# Patient Record
Sex: Female | Born: 1980 | Race: White | Hispanic: No | Marital: Married | Smoking: Never smoker
Health system: Southern US, Community
[De-identification: ages and names within clinical notes are randomized; demographics above are authoritative.]

---

## 2005-09-30 ENCOUNTER — Ambulatory Visit: Payer: Self-pay | Admitting: Internal Medicine

## 2007-09-28 ENCOUNTER — Ambulatory Visit (HOSPITAL_COMMUNITY): Admission: RE | Admit: 2007-09-28 | Discharge: 2007-09-28 | Payer: Self-pay | Admitting: Obstetrics

## 2008-09-19 ENCOUNTER — Inpatient Hospital Stay (HOSPITAL_COMMUNITY): Admission: AD | Admit: 2008-09-19 | Discharge: 2008-09-22 | Payer: Self-pay | Admitting: Obstetrics

## 2008-09-23 ENCOUNTER — Encounter: Admission: RE | Admit: 2008-09-23 | Discharge: 2008-10-22 | Payer: Self-pay | Admitting: Obstetrics

## 2008-10-23 ENCOUNTER — Encounter: Admission: RE | Admit: 2008-10-23 | Discharge: 2008-10-28 | Payer: Self-pay | Admitting: Obstetrics

## 2010-06-08 ENCOUNTER — Inpatient Hospital Stay (HOSPITAL_COMMUNITY): Admission: AD | Admit: 2010-06-08 | Payer: Self-pay | Admitting: Obstetrics and Gynecology

## 2010-08-25 ENCOUNTER — Inpatient Hospital Stay (HOSPITAL_COMMUNITY): Payer: BC Managed Care – PPO

## 2010-08-25 ENCOUNTER — Inpatient Hospital Stay (HOSPITAL_COMMUNITY)
Admission: AD | Admit: 2010-08-25 | Discharge: 2010-08-25 | Disposition: A | Discharge status: Home / Self Care | Payer: BC Managed Care – PPO | Source: Ambulatory Visit | Attending: Obstetrics and Gynecology | Admitting: Obstetrics and Gynecology

## 2010-08-25 DIAGNOSIS — O26859 Spotting complicating pregnancy, unspecified trimester: Secondary | ICD-10-CM | POA: Insufficient documentation

## 2010-08-25 LAB — URINALYSIS, ROUTINE W REFLEX MICROSCOPIC
Bilirubin Urine: NEGATIVE
Hgb urine dipstick: NEGATIVE
Ketones, ur: NEGATIVE mg/dL
Specific Gravity, Urine: 1.025 (ref 1.005–1.030)
Urobilinogen, UA: 0.2 mg/dL (ref 0.0–1.0)

## 2010-08-29 NOTE — Consult Note (Signed)
  [name], SPRAGGINS            ACCOUNT NO.:  [id number]  MEDICAL RECORD NO.:  [id number]           PATIENT TYPE:  O  LOCATION:  [facility]                         [facility]  PHYSICIAN:  Lenoard Aden, M.D.DATE OF BIRTH:  04-13-1981  DATE OF CONSULTATION: DATE OF DISCHARGE:                                CONSULTATION   CHIEF COMPLAINT:  Episode of spotting.  HISTORY OF PRESENT ILLNESS:  She is a 30 year old white female G2, P1 at 29-6/7 weeks' gestation with a known history of placenta previa with a low-lying placenta on her most recent ultrasound approximately 1.8 cm from the cervical os who presents now with 1 episode of spotting on tissue paper this morning.  Her prenatal course has been otherwise uncomplicated.  ALLERGIES:  She has allergies to CECLOR.  PAST MEDICAL HISTORY:  She has a medical history remarkable for gastroesophageal reflux.  MEDICATIONS:  Include; iron, prenatal vitamins and Zyrtec.  FAMILY HISTORY:  ALS, anemia, thyroid dysfunction, diabetes and cystic fibrosis.  She has a history of vaginal delivery in 2010, otherwise, uncomplicated prenatal course today complicated only by previa.  PHYSICAL EXAMINATION:  GENERAL:  This is a well-developed, well- nourished white female in no acute distress. VITAL SIGNS:  Blood pressure 123/70, pulse of 69-91, respirations 16, temperature 97.9. HEENT:  Normal. NECK:  Supple, full range of motion. LUNGS:  Clear. HEART:  Regular rhythm. ABDOMEN:  Soft, gravid, nontender.  No CVA tenderness. EXTREMITIES:  No cords. NEUROLOGIC:  Nonfocal. SKIN:  Intact. PELVIC:  Deferred.  LABORATORY DATA:  Urinalysis is negative.  Fetal heart rate tracing is category one with fetal heart tones in the 140-150 beat per minute range accelerations, no contractions noted.  No decelerations noted. Ultrasound performed reveals evidence of the placenta approximately 3- 1/2 cm from the internal cervical os.  No evidence of  subchorionic hematoma, normal amniotic fluid index, normal cervical length of approximately 4.5 cm.  No funneling noted.  IMPRESSION:  A 29-6/7 week intrauterine pregnancy, 2 episode of spotting unrelated to placentation with no evidence of low-lying placenta or percent previa and normal urinalysis.  No subchorionic hemorrhage.  No preterm cervical change.  PLAN:  Discharge home.  Precautions given.  Follow up in the office as scheduled.  The patient's blood type is O+.     Lenoard Aden, M.D.     RJT/MEDQ  D:  08/25/2010  T:  08/25/2010  Job:  [id number]  Electronically Signed by Olivia Mackie M.D. on 08/29/2010 01:37:06 PM

## 2010-09-18 LAB — CBC
HCT: 29.6 % — ABNORMAL LOW (ref 36.0–46.0)
Hemoglobin: 13.1 g/dL (ref 12.0–15.0)
MCHC: 34.1 g/dL (ref 30.0–36.0)
MCV: 88.6 fL (ref 78.0–100.0)
MCV: 88.6 fL (ref 78.0–100.0)
Platelets: 141 10*3/uL — ABNORMAL LOW (ref 150–400)
RBC: 4.32 MIL/uL (ref 3.87–5.11)
RDW: 13.5 % (ref 11.5–15.5)

## 2010-10-22 NOTE — H&P (Signed)
NAMELATEEFAH, [name]            ACCOUNT NO.:  [id number]   MEDICAL RECORD NO.:  [id number]          PATIENT TYPE:  INP   LOCATION:  9107                          [facility]ILITY:  [facility]   PHYSICIAN:  Lenoard Aden, M.D.DATE OF BIRTH:  April 14, 1981   DATE OF ADMISSION:  09/19/2008  DATE OF DISCHARGE:                              HISTORY & PHYSICAL   CHIEF COMPLAINT:  Labor.   She is a 30 year old white female G1, P0 at 12 and 4/7th weeks'  gestation with increased frequency of contractions today.   ALLERGIES:  She has allergy to Ceclor.   MEDICATIONS:  Prenatal vitamins.   History of skin grafting on her mouth, I believe in 2009.  History of  chlamydia.  She is a nonsmoker, nondrinker and denies domestic physical  violence.   FAMILY HISTORY:  Cystic fibrosis, diabetes, anemia, ALS, thyroid  dysfunction.   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  GBS is negative.   PHYSICAL EXAMINATION:  GENERAL:  Well-developed, well-nourished white  female in no acute distress.  HEENT:  Normal.  LUNGS:  Clear.  HEART:  Regular rate and rhythm.  ABDOMEN:  Soft, gravid, and nontender.  Estimated fetal weight is 8  pounds.  Cervix is 2, 90% vertex, -1.  EXTREMITIES:  There are no cords.  NEUROLOGIC:  Nonfocal.  SKIN:  Intact.   NST is reactive.  Contractions are every 3-6 minutes.   IMPRESSION:  A 40-week intrauterine pregnancy in early labor.   PLAN:  Admit, amniotomy, epidural p.r.n., anticipate cautious attempts  at vaginal delivery.      Lenoard Aden, M.D.  Electronically Signed     RJT/MEDQ  D:  09/19/2008  T:  09/20/2008  Job:  [id number]

## 2010-10-27 ENCOUNTER — Inpatient Hospital Stay (HOSPITAL_COMMUNITY)
Admission: AD | Admit: 2010-10-27 | Discharge: 2010-10-29 | DRG: 373 | Disposition: A | Discharge status: Home / Self Care | Payer: BC Managed Care – PPO | Source: Ambulatory Visit | Attending: Obstetrics and Gynecology | Admitting: Obstetrics and Gynecology

## 2010-10-27 LAB — CBC
MCHC: 34.9 g/dL (ref 30.0–36.0)
Platelets: 187 10*3/uL (ref 150–400)
RDW: 13.4 % (ref 11.5–15.5)

## 2010-10-28 LAB — ABO/RH: ABO/RH(D): O POS

## 2010-10-28 LAB — RPR: RPR Ser Ql: NONREACTIVE

## 2010-10-29 ENCOUNTER — Encounter (HOSPITAL_COMMUNITY)
Admission: RE | Admit: 2010-10-29 | Discharge: 2010-10-29 | Disposition: A | Discharge status: Home / Self Care | Payer: BC Managed Care – PPO | Source: Ambulatory Visit | Attending: Obstetrics and Gynecology | Admitting: Obstetrics and Gynecology

## 2010-10-29 DIAGNOSIS — O923 Agalactia: Secondary | ICD-10-CM | POA: Insufficient documentation

## 2010-10-29 LAB — CBC
MCHC: 33.2 g/dL (ref 30.0–36.0)
RDW: 13.2 % (ref 11.5–15.5)

## 2010-11-08 NOTE — H&P (Signed)
  NAMEMICHAELLE, [name]            ACCOUNT NO.:  [id number]  MEDICAL RECORD NO.:  [id number]        PATIENT TYPE:  WINP  LOCATION:  167                           [facility]  PHYSICIAN:  Lenoard Aden, M.D.DATE OF BIRTH:  1981/01/08  DATE OF ADMISSION:  10/27/2010 DATE OF DISCHARGE:                             HISTORY & PHYSICAL   CHIEF COMPLAINT:  The patient is a 39 weeks with a history of spontaneous fetal cephalohematoma, poor controlled induction, and avoid operative vaginal delivery.  The patient declines primary C-section. She is a 30 year old white female G2, P1, with a history of spontaneous cephalohematoma with long-term stigmata with a previous pregnancy.  The patient wishes to avoid recurrence.  Risks and benefits were discussed. The patient declines primary C-section.  ALLERGIES:  She has allergies to CECLOR.  MEDICATIONS:  Prenatal vitamins and Zyrtec as needed.  FAMILY HISTORY:  She has a family history of ALS, anemia, cystic fibrosis, thyroid dysfunction, diabetes.  She has a history as noted of 7 pounds 7 ounces female born in 2010.  The prenatal course today uncomplicated.  She has also surgical history remarkable for skin grafting in her mouth includes wisdom tooth extraction.  PHYSICAL EXAMINATION:  GENERAL:  She is a well-developed, well-nourished white female, in no acute distress. HEENT:  Normal. LUNGS:  Clear. HEART:  Regular rate and rhythm. ABDOMEN:  Soft, gravid, nontender.  Estimated fetal weight 7.5 pounds. Cervix is 2-3 cm, 70%, vertex -1. EXTREMITIES:  There are no cords. NEUROLOGIC:  Nonfocal. SKIN:  Intact.  Cervidil was placed.  NST is reactive.  IMPRESSION:  A 39-week intrauterine pregnancy who attempts for cervical ripening and vaginal delivery.  Previous history of spontaneous cephalohematoma, desire to avoid.  Potential risks and benefits of proceeding with unable to push, and inability to prevent spontaneous recurrence  were discussed.  The patient acknowledges and wishes to proceed.  We will proceed with Cervidil, Pitocin in a.m.  Epidural as needed.     Lenoard Aden, M.D.     RJT/MEDQ  D:  10/27/2010  T:  10/28/2010  Job:  [id number]  Electronically Signed by [name] M.D. on 11/08/2010 02:01:00 PM

## 2013-01-31 ENCOUNTER — Other Ambulatory Visit (HOSPITAL_BASED_OUTPATIENT_CLINIC_OR_DEPARTMENT_OTHER): Payer: Self-pay | Admitting: Family Medicine

## 2013-01-31 DIAGNOSIS — R1011 Right upper quadrant pain: Secondary | ICD-10-CM

## 2013-02-01 ENCOUNTER — Ambulatory Visit (HOSPITAL_BASED_OUTPATIENT_CLINIC_OR_DEPARTMENT_OTHER)
Admission: RE | Admit: 2013-02-01 | Discharge: 2013-02-01 | Disposition: A | Discharge status: Home / Self Care | Payer: BC Managed Care – PPO | Source: Ambulatory Visit | Attending: Family Medicine | Admitting: Family Medicine

## 2013-02-01 DIAGNOSIS — R1011 Right upper quadrant pain: Secondary | ICD-10-CM | POA: Insufficient documentation

## 2013-02-08 ENCOUNTER — Other Ambulatory Visit (HOSPITAL_COMMUNITY): Payer: Self-pay | Admitting: Family Medicine

## 2013-02-08 DIAGNOSIS — R1011 Right upper quadrant pain: Secondary | ICD-10-CM

## 2013-02-15 ENCOUNTER — Ambulatory Visit (HOSPITAL_COMMUNITY)
Admission: RE | Admit: 2013-02-15 | Discharge: 2013-02-15 | Disposition: A | Discharge status: Home / Self Care | Payer: BC Managed Care – PPO | Source: Ambulatory Visit | Attending: Family Medicine | Admitting: Family Medicine

## 2013-02-15 DIAGNOSIS — R110 Nausea: Secondary | ICD-10-CM | POA: Insufficient documentation

## 2013-02-15 DIAGNOSIS — R1011 Right upper quadrant pain: Secondary | ICD-10-CM

## 2013-02-15 MED ORDER — TECHNETIUM TC 99M MEBROFENIN IV KIT
5.5000 | PACK | Freq: Once | INTRAVENOUS | Status: AC | PRN
  Administered 2013-02-15: 6 via INTRAVENOUS

## 2013-02-28 ENCOUNTER — Other Ambulatory Visit (HOSPITAL_BASED_OUTPATIENT_CLINIC_OR_DEPARTMENT_OTHER): Payer: Self-pay | Admitting: Family Medicine

## 2013-02-28 DIAGNOSIS — R1011 Right upper quadrant pain: Secondary | ICD-10-CM

## 2013-03-02 ENCOUNTER — Ambulatory Visit (HOSPITAL_BASED_OUTPATIENT_CLINIC_OR_DEPARTMENT_OTHER)
Admission: RE | Admit: 2013-03-02 | Discharge: 2013-03-02 | Disposition: A | Discharge status: Home / Self Care | Payer: BC Managed Care – PPO | Source: Ambulatory Visit | Attending: Family Medicine | Admitting: Family Medicine

## 2013-03-02 DIAGNOSIS — R1011 Right upper quadrant pain: Secondary | ICD-10-CM | POA: Insufficient documentation

## 2013-03-02 MED ORDER — IOHEXOL 300 MG/ML  SOLN
100.0000 mL | Freq: Once | INTRAMUSCULAR | Status: AC | PRN
  Administered 2013-03-02: 100 mL via INTRAVENOUS

## 2013-11-18 ENCOUNTER — Encounter (HOSPITAL_BASED_OUTPATIENT_CLINIC_OR_DEPARTMENT_OTHER): Payer: Self-pay | Admitting: Emergency Medicine

## 2013-11-18 ENCOUNTER — Emergency Department (HOSPITAL_BASED_OUTPATIENT_CLINIC_OR_DEPARTMENT_OTHER)
Admission: EM | Admit: 2013-11-18 | Discharge: 2013-11-18 | Disposition: A | Discharge status: Home / Self Care | Payer: Commercial Managed Care - PPO | Attending: Emergency Medicine | Admitting: Emergency Medicine

## 2013-11-18 DIAGNOSIS — S71109A Unspecified open wound, unspecified thigh, initial encounter: Principal | ICD-10-CM | POA: Insufficient documentation

## 2013-11-18 DIAGNOSIS — F411 Generalized anxiety disorder: Secondary | ICD-10-CM | POA: Insufficient documentation

## 2013-11-18 DIAGNOSIS — W64XXXA Exposure to other animate mechanical forces, initial encounter: Secondary | ICD-10-CM | POA: Insufficient documentation

## 2013-11-18 DIAGNOSIS — S71152A Open bite, left thigh, initial encounter: Secondary | ICD-10-CM

## 2013-11-18 DIAGNOSIS — Z3202 Encounter for pregnancy test, result negative: Secondary | ICD-10-CM | POA: Insufficient documentation

## 2013-11-18 DIAGNOSIS — Z792 Long term (current) use of antibiotics: Secondary | ICD-10-CM | POA: Insufficient documentation

## 2013-11-18 DIAGNOSIS — Y9319 Activity, other involving water and watercraft: Secondary | ICD-10-CM | POA: Insufficient documentation

## 2013-11-18 DIAGNOSIS — Y929 Unspecified place or not applicable: Secondary | ICD-10-CM | POA: Insufficient documentation

## 2013-11-18 DIAGNOSIS — S71009A Unspecified open wound, unspecified hip, initial encounter: Secondary | ICD-10-CM | POA: Insufficient documentation

## 2013-11-18 LAB — COMPREHENSIVE METABOLIC PANEL
ALBUMIN: 4.6 g/dL (ref 3.5–5.2)
ALT: 13 U/L (ref 0–35)
AST: 16 U/L (ref 0–37)
Alkaline Phosphatase: 52 U/L (ref 39–117)
BILIRUBIN TOTAL: 0.3 mg/dL (ref 0.3–1.2)
BUN: 17 mg/dL (ref 6–23)
CALCIUM: 9.9 mg/dL (ref 8.4–10.5)
CHLORIDE: 100 meq/L (ref 96–112)
CO2: 25 meq/L (ref 19–32)
CREATININE: 0.8 mg/dL (ref 0.50–1.10)
GFR calc Af Amer: >90 mL/min (ref >90)
Glucose, Bld: 94 mg/dL (ref 70–99)
Potassium: 3.9 mEq/L (ref 3.7–5.3)
Sodium: 138 mEq/L (ref 137–147)
Total Protein: 7.6 g/dL (ref 6.0–8.3)

## 2013-11-18 LAB — URINALYSIS, ROUTINE W REFLEX MICROSCOPIC
BILIRUBIN URINE: NEGATIVE
Glucose, UA: NEGATIVE mg/dL
Hgb urine dipstick: NEGATIVE
KETONES UR: NEGATIVE mg/dL
LEUKOCYTES UA: NEGATIVE
NITRITE: NEGATIVE
Protein, ur: NEGATIVE mg/dL
Specific Gravity, Urine: 1.005 (ref 1.005–1.030)
UROBILINOGEN UA: 0.2 mg/dL (ref 0.0–1.0)
pH: 5.5 (ref 5.0–8.0)

## 2013-11-18 LAB — CBC WITH DIFFERENTIAL/PLATELET
BASOS ABS: 0 10*3/uL (ref 0.0–0.1)
BASOS PCT: 0 % (ref 0–1)
EOS PCT: 1 % (ref 0–5)
Eosinophils Absolute: 0 10*3/uL (ref 0.0–0.7)
HEMATOCRIT: 37.6 % (ref 36.0–46.0)
HEMOGLOBIN: 13 g/dL (ref 12.0–15.0)
Lymphocytes Relative: 20 % (ref 12–46)
Lymphs Abs: 1.4 10*3/uL (ref 0.7–4.0)
MCH: 31.3 pg (ref 26.0–34.0)
MCHC: 34.6 g/dL (ref 30.0–36.0)
MCV: 90.4 fL (ref 78.0–100.0)
MONO ABS: 0.3 10*3/uL (ref 0.1–1.0)
MONOS PCT: 5 % (ref 3–12)
NEUTROS ABS: 5 10*3/uL (ref 1.7–7.7)
Neutrophils Relative %: 74 % (ref 43–77)
Platelets: 179 10*3/uL (ref 150–400)
RBC: 4.16 MIL/uL (ref 3.87–5.11)
RDW: 11.3 % — AB (ref 11.5–15.5)
WBC: 6.7 10*3/uL (ref 4.0–10.5)

## 2013-11-18 LAB — PREGNANCY, URINE: Preg Test, Ur: NEGATIVE

## 2013-11-18 LAB — PROTIME-INR
INR: 0.99 (ref 0.00–1.49)
PROTHROMBIN TIME: 12.9 s (ref 11.6–15.2)

## 2013-11-18 MED ORDER — DOXYCYCLINE HYCLATE 100 MG PO CAPS: 100.0000 mg | ORAL_CAPSULE | Freq: Two times a day (BID) | ORAL | Status: AC

## 2013-11-18 NOTE — Discharge Instructions (Signed)
Animal Bite Animal bite wounds can get infected. It is important to get proper medical treatment. Ask your doctor if you need a rabies shot. HOME CARE   Follow your doctor's instructions for taking care of your wound.  Only take medicine as told by your doctor.  Take your medicine (antibiotics) as told. Finish them even if you start to feel better.  Keep all doctor visits as told. You may need a tetanus shot if:   You cannot remember when you had your last tetanus shot.  You have never had a tetanus shot.  The injury broke your skin. If you need a tetanus shot and you choose not to have one, you may get tetanus. Sickness from tetanus can be serious. GET HELP RIGHT AWAY IF:   Your wound is warm, red, sore, or puffy (swollen).  You notice yellowish-white fluid (pus) or a bad smell coming from the wound.  You see a red line on the skin coming from the wound.  You have a fever, chills, or you feel sick.  You feel sick to your stomach (nauseous), or you throw up (vomit).  Your pain does not go away, or it gets worse.  You have trouble moving the injured part.  You have questions or concerns. MAKE SURE YOU:   Understand these instructions.  Will watch your condition.  Will get help right away if you are not doing well or get worse. Document Released: 05/26/2005 Document Revised: 08/18/2011 Document Reviewed: 01/15/2011 [facility] Patient Information 2014 [employer], [place].  Antibiotic change to doxycycline from Augmentin due to your allergy to Ceclor. Would be reasonable to hold off on taking antibiotics see if there is any development of infection. As we discussed this bite does not seem to be consistent at all with a snake bite. However for any newer worse symptoms please return.

## 2013-11-18 NOTE — ED Provider Notes (Signed)
CSN: [id number]     Arrival date & time 11/18/13  1831 History   This chart was scribed for Vanetta Mulders, MD by Quintella Reichert, ED scribe.  This patient was seen in room MH05/MH05 and the patient's care was started at 7:50 PM.    Chief Complaint  Patient presents with  . Snake Bite    Patient is a 33 y.o. female presenting with animal bite. The history is provided by the patient. No language interpreter was used.  Animal Bite Contact animal:  Snake Location:  Leg Leg injury location:  L upper leg Time since incident:  2 hours Pain details:    Pain severity now: Initially 10/10, now 1/10.   Progression:  Partially resolved [place]ident [place] Provoked: unprovoked   Animal in possession: no   Tetanus status:  Up to date Associated symptoms: no fever, no rash and no swelling     HPI Comments: [name] is a 33 y.o. female who presents to the Emergency Department complaining of a possible snake bite sustained tonight at 5:30 PM.  Pt states she was [place] nearby when "I got snapped" in her left lateral thigh.  She did not see what bit her.  When she got out of the water she noticed two bleeding marks and a bruise to that area.  Initially she had associated 10/10 pain to the area but this has since improved to a severity of 1/10.  She has not noticed any swelling.  She reports the leg feels slightly "stiff" but she feels this may be due to how she is holding the leg.  She also states she has been anxious since the incident and feels "jittery" but she attributes this to anxiety.  Tetanus is UTD.   History reviewed. No pertinent past medical history.  History reviewed. No pertinent past surgical history.  No family history on file.   History  Substance Use Topics  . Smoking status: Never Smoker   . Smokeless tobacco: Not on file  . Alcohol Use: Yes    OB History   Grav Para Term Preterm Abortions TAB SAB Ect Mult Living                   Review  of Systems  Constitutional: Negative for fever and chills.  HENT: Negative for rhinorrhea and sore throat.   Eyes: Negative for visual disturbance.  Respiratory: Negative for cough and shortness of breath.   Cardiovascular: Negative for chest pain and leg swelling.  Gastrointestinal: Negative for nausea, vomiting, abdominal pain and diarrhea.  Genitourinary: Negative for dysuria and hematuria.  Musculoskeletal: Negative for back pain, joint swelling and neck pain.  Skin: Positive for wound (possible snake bite). Negative for rash.  Neurological: Negative for dizziness and headaches.  Hematological: Does not bruise/bleed easily.  Psychiatric/Behavioral: Negative for confusion. The patient is nervous/anxious.       Allergies  Ceclor  Home Medications   Prior to Admission medications   Medication Sig Start Date End Date Taking? Authorizing Provider  Loratadine (CLARITIN PO) Take by mouth.   Yes Historical Provider, MD  doxycycline (VIBRAMYCIN) 100 MG capsule Take 1 capsule (100 mg total) by mouth 2 (two) times daily. 11/18/13   Vanetta Mulders, MD   Triage Vitals: BP 135/74  Pulse 109  Temp(Src) 98.3 F (36.8 C) (Oral)  Resp 16  Ht 5\' 5"  (1.651 m)  Wt 140 lb (63.504 kg)  BMI 23.30 kg/m2  SpO2 100%  LMP 11/02/2013  Physical Exam  Nursing note and vitals reviewed. Constitutional: She is oriented to person, place, and time. She appears well-developed and well-nourished. No distress.  HENT:  Head: Normocephalic and atraumatic.  Eyes: Conjunctivae and EOM are normal.  Neck: Normal range of motion. No tracheal deviation present.  Cardiovascular: Normal rate and regular rhythm.   DP pulses 2+  Pulmonary/Chest: Effort normal and breath sounds normal. No respiratory distress. She has no wheezes. She has no rales.  Abdominal: Bowel sounds are normal. There is no tenderness.  Musculoskeletal: Normal range of motion. She exhibits no edema.  Neurological: She is alert and oriented  to person, place, and time. No cranial nerve deficit.  Skin: Skin is warm and dry.  1.5-cm circular area with some bruising, and two very close puncture or bite wounds approximately 2 mm apart, at the distal lateral left thigh.  No skin changes.  Proximal discomfort up 10 cm along that area.  Psychiatric: She has a normal mood and affect. Her behavior is normal.     ED Course  Procedures (including critical care time)  DIAGNOSTIC STUDIES: Oxygen Saturation is 100% on room air, normal by my interpretation.    COORDINATION OF CARE: 8:06 PM-Discussed treatment plan which includes labs, EKG, and antibiotics with pt at bedside and pt agreed to plan.    Labs Review Labs Reviewed  CBC WITH DIFFERENTIAL - Abnormal; Notable for the following:    RDW 11.3 (*)    All other components within normal limits  COMPREHENSIVE METABOLIC PANEL  PROTIME-INR  URINALYSIS, ROUTINE W REFLEX MICROSCOPIC  PREGNANCY, URINE   Results for orders placed during the hospital encounter of 11/18/13  CBC WITH DIFFERENTIAL      Result Value Ref Range   WBC 6.7  4.0 - 10.5 K/uL   RBC 4.16  3.87 - 5.11 MIL/uL   Hemoglobin 13.0  12.0 - 15.0 g/dL   HCT 16.137.6  09.636.0 - 04.546.0 %   MCV 90.4  78.0 - 100.0 fL   MCH 31.3  26.0 - 34.0 pg   MCHC 34.6  30.0 - 36.0 g/dL   RDW 40.911.3 (*) 81.111.5 - 91.415.5 %   Platelets 179  150 - 400 K/uL   Neutrophils Relative % 74  43 - 77 %   Neutro Abs 5.0  1.7 - 7.7 K/uL   Lymphocytes Relative 20  12 - 46 %   Lymphs Abs 1.4  0.7 - 4.0 K/uL   Monocytes Relative 5  3 - 12 %   Monocytes Absolute 0.3  0.1 - 1.0 K/uL   Eosinophils Relative 1  0 - 5 %   Eosinophils Absolute 0.0  0.0 - 0.7 K/uL   Basophils Relative 0  0 - 1 %   Basophils Absolute 0.0  0.0 - 0.1 K/uL  COMPREHENSIVE METABOLIC PANEL      Result Value Ref Range   Sodium 138  137 - 147 mEq/L   Potassium 3.9  3.7 - 5.3 mEq/L   Chloride 100  96 - 112 mEq/L   CO2 25  19 - 32 mEq/L   Glucose, Bld 94  70 - 99 mg/dL   BUN 17  6 - 23  mg/dL   Creatinine, Ser 7.820.80  0.50 - 1.10 mg/dL   Calcium 9.9  8.4 - 95.610.5 mg/dL   Total Protein 7.6  6.0 - 8.3 g/dL   Albumin 4.6  3.5 - 5.2 g/dL   AST 16  0 - 37 U/L   ALT 13  0 - 35 U/L   Alkaline Phosphatase 52  39 - 117 U/L   Total Bilirubin 0.3  0.3 - 1.2 mg/dL   GFR calc non Af Amer >90  >90 mL/min   GFR calc Af Amer >90  >90 mL/min  PROTIME-INR      Result Value Ref Range   Prothrombin Time 12.9  11.6 - 15.2 seconds   INR 0.99  0.00 - 1.49  URINALYSIS, ROUTINE W REFLEX MICROSCOPIC      Result Value Ref Range   Color, Urine YELLOW  YELLOW   APPearance CLEAR  CLEAR   Specific Gravity, Urine 1.005  1.005 - 1.030   pH 5.5  5.0 - 8.0   Glucose, UA NEGATIVE  NEGATIVE mg/dL   Hgb urine dipstick NEGATIVE  NEGATIVE   Bilirubin Urine NEGATIVE  NEGATIVE   Ketones, ur NEGATIVE  NEGATIVE mg/dL   Protein, ur NEGATIVE  NEGATIVE mg/dL   Urobilinogen, UA 0.2  0.0 - 1.0 mg/dL   Nitrite NEGATIVE  NEGATIVE   Leukocytes, UA NEGATIVE  NEGATIVE  PREGNANCY, URINE      Result Value Ref Range   Preg Test, Ur NEGATIVE  NEGATIVE     Imaging Review No results found.    EKG Interpretation   Date/Time:  Friday November 18 2013 20:38:25 EDT Ventricular Rate:  72 PR Interval:  148 QRS Duration: 124 QT Interval:  408 QTC Calculation: 446 R Axis:   69 Text Interpretation:  Normal sinus rhythm RSR' or QR pattern in V1  suggests right ventricular conduction delay T wave abnormality, consider  anterior ischemia Abnormal ECG No previous ECGs available Confirmed by  Aubreigh Fuerte  MD, Cashmere Dingley [phone]([id number]) on 11/18/2013 8:41:04 PM      MDM   Final diagnoses:  Bite of left thigh    Patient with a bite to the lateral part of her thigh does not appear to be consistent with a snake bite at least no evidence of envenomation. What bit her was on [place] It occurred at about 5:30 so 4 hours later no significant change in the wound. Wound could be consistent with a fish bite  and/or turtle bite. Will treat patient with prophylactic antibiotics. Patient will return for any new or worse symptoms. Patient's labs without any abnormalities. Patient without any systemic symptoms.    I personally performed the services described in this documentation, which was scribed in my presence. The recorded information has been reviewed and is accurate.    Vanetta MuldersScott Matther Labell, MD 11/18/13 2144

## 2013-11-18 NOTE — ED Notes (Signed)
Pt was floating in [place]-? Snake bite to left upper outer leg-small red area noted

## 2014-08-25 IMAGING — CT CT ABDOMEN W/ CM
2 of 4 series · 17 of 46 positions shown, 19 images · IV contrast (omnipaque)
Comparison: Hepatobiliary scan 02/15/2013, abdominal ultrasound
02/01/2013

CLINICAL DATA: Right upper quadrant abdominal pain

CT ABDOMEN WITH CONTRAST
TECHNIQUE: Multidetector CT imaging of the abdomen was performed
following the standard protocol during bolus administration of
intravenous contrast.
Contrast: 100mL OMNIPAQUE IOHEXOL 300 MG/ML  SOLN

[Series 2: abd 5.0 b31f · axial · 0.75mm/px · z∈[-305,-85]mm · 14 of 50 slices shown, 16 images]
[im 3/50  soft-tissue]
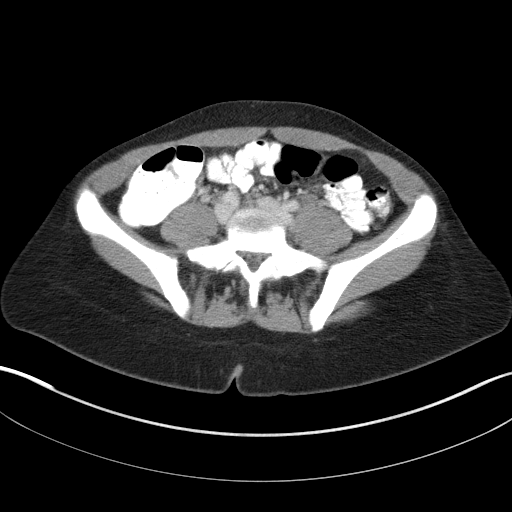
[im 3/50  bone]
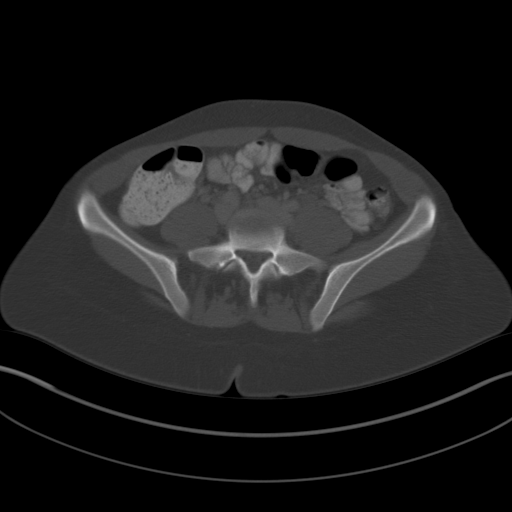
[im 8/50  soft-tissue]
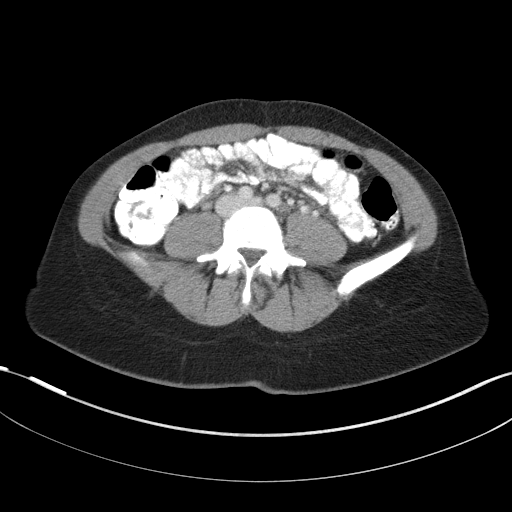
[im 10/50  soft-tissue]
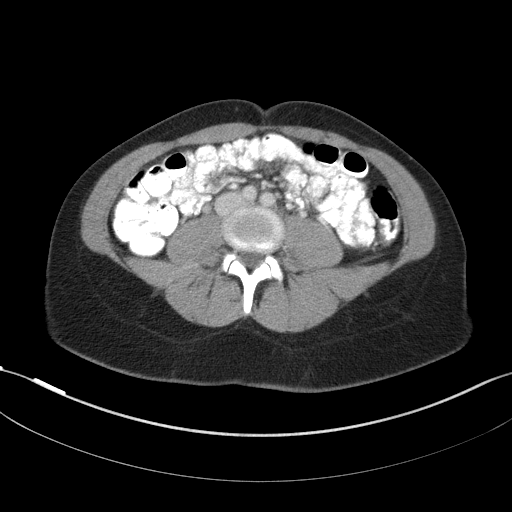
[im 15/50  soft-tissue]
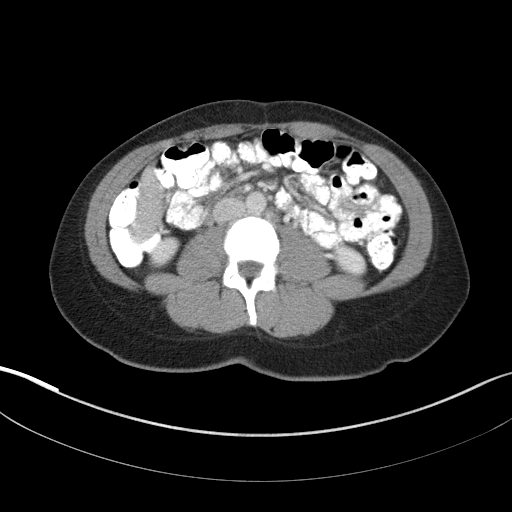
[im 17/50  soft-tissue]
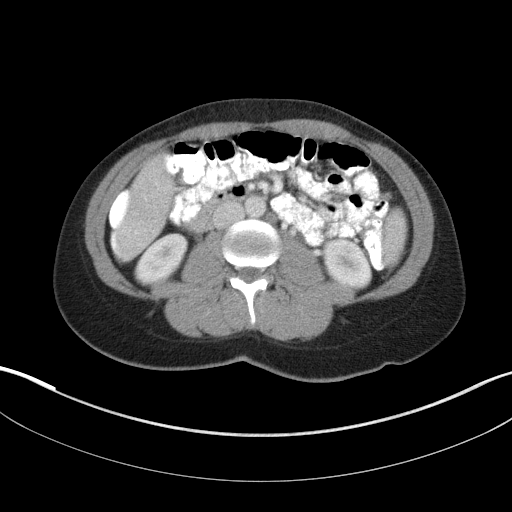
[im 19/50  soft-tissue]
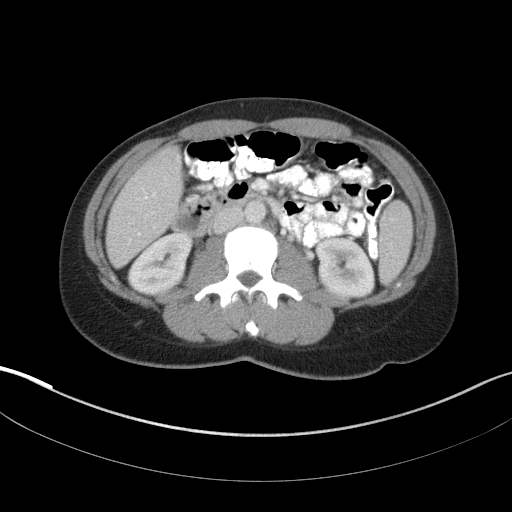
[im 24/50  soft-tissue]
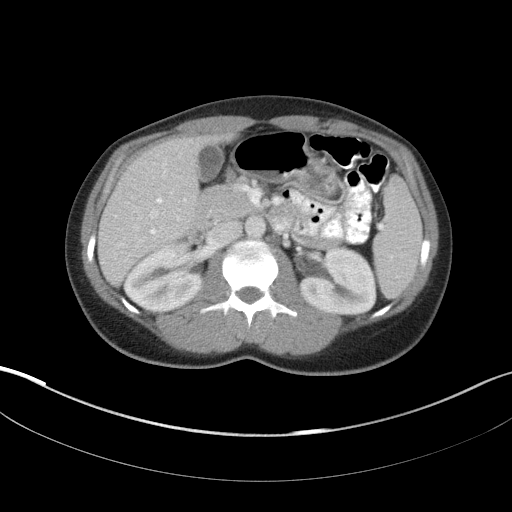
[im 26/50  soft-tissue]
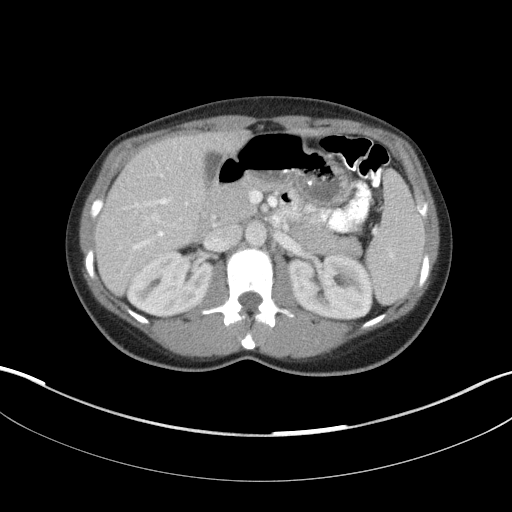
[im 31/50  soft-tissue]
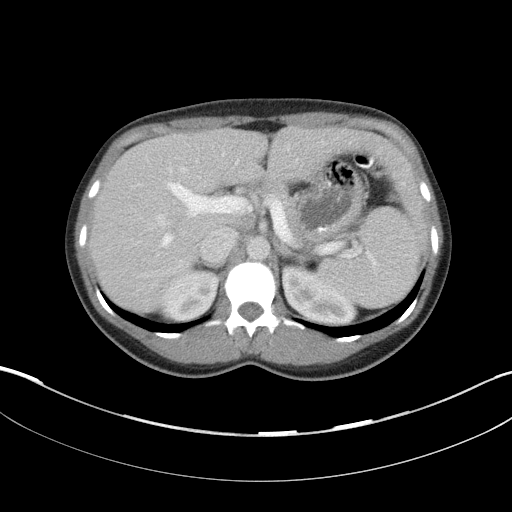
[im 31/50  bone]
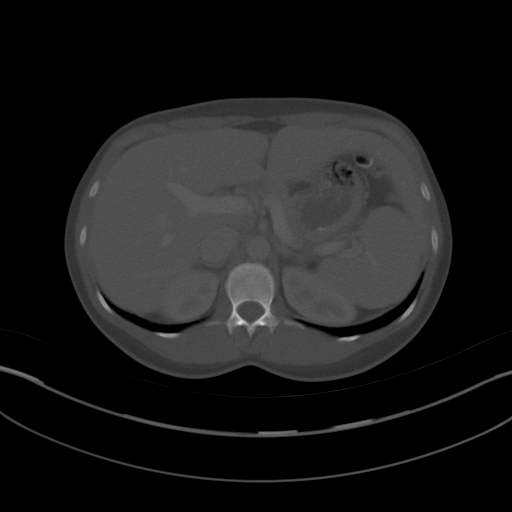
[im 33/50  soft-tissue]
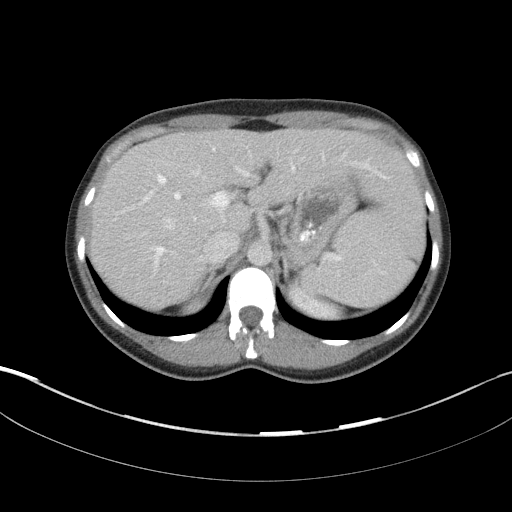
[im 38/50  soft-tissue]
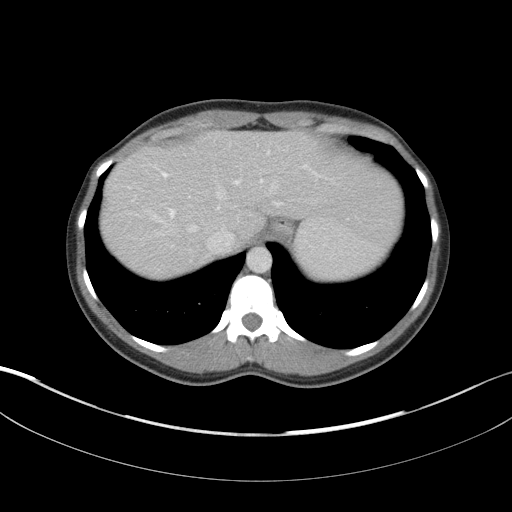
[im 40/50  soft-tissue]
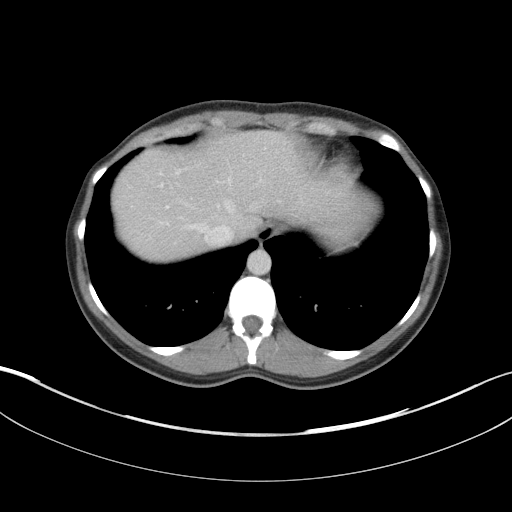
[im 43/50  soft-tissue]
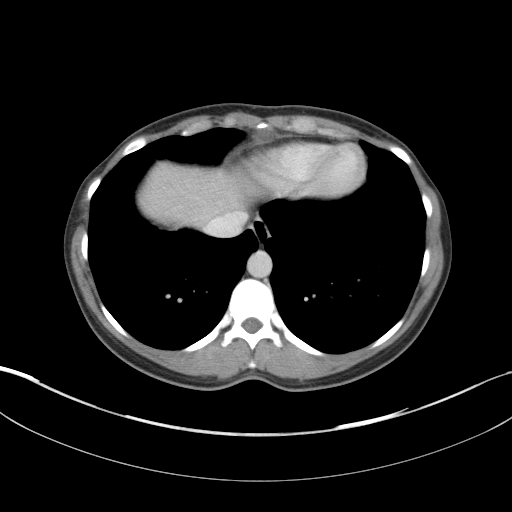
[im 47/50  soft-tissue]
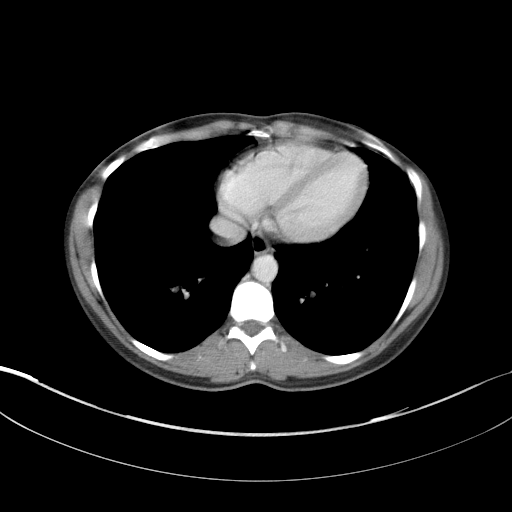

[Series 5: abd 3.0 coronal · coronal · 0.52mm/px · 3 of 72 slices shown]
[im 24/72  soft-tissue]
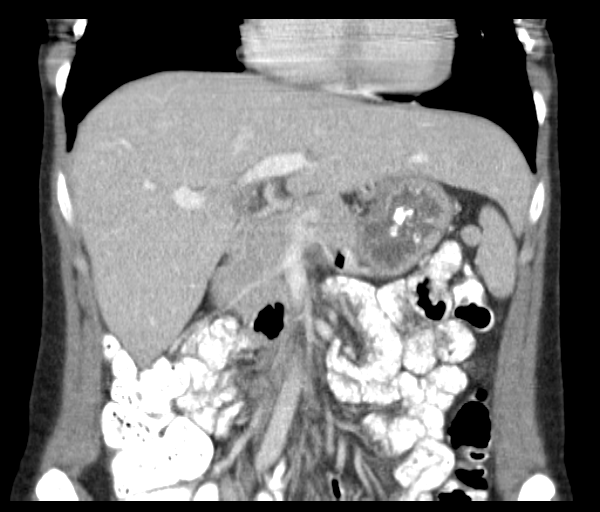
[im 32/72  soft-tissue]
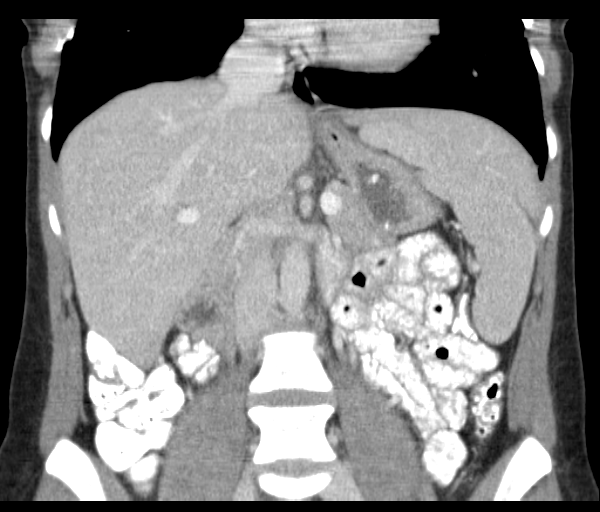
[im 40/72  soft-tissue]
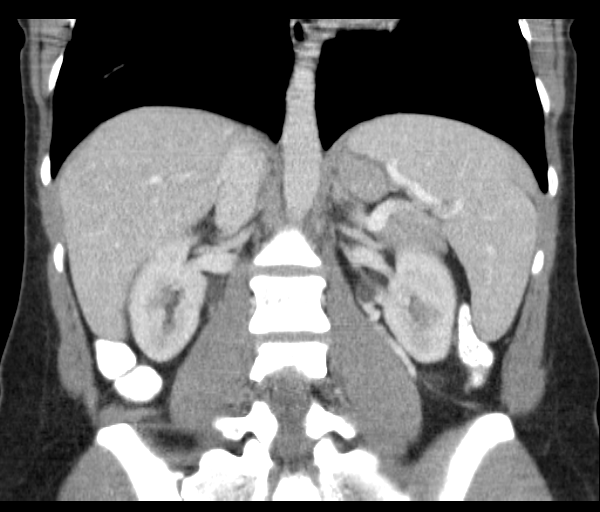

[17 of 46 positions shown; findings below may reference images not displayed]

FINDINGS: Mild motion artifact noted at the lung bases.  Adrenal
glands, kidneys, liver, spleen, and gallbladder are normal.  No
ascites or lymphadenopathy.  Visualized bowel is unremarkable.  No
acute osseous finding.
IMPRESSION: No acute intra-abdominal pathology.

## 2017-02-26 ENCOUNTER — Encounter: Payer: Self-pay | Admitting: Family Medicine
